# Patient Record
Sex: Male | Born: 1950 | Race: White | Hispanic: No | Marital: Single
Health system: Southern US, Community
[De-identification: ages and names within clinical notes are randomized; demographics above are authoritative.]

---

## 2004-03-12 ENCOUNTER — Encounter: Payer: Self-pay | Admitting: Psychiatry

## 2004-04-12 ENCOUNTER — Encounter: Payer: Self-pay | Admitting: Psychiatry

## 2004-05-12 ENCOUNTER — Encounter: Payer: Self-pay | Admitting: Psychiatry

## 2004-06-12 ENCOUNTER — Encounter: Payer: Self-pay | Admitting: Psychiatry

## 2004-07-13 ENCOUNTER — Encounter: Payer: Self-pay | Admitting: Psychiatry

## 2004-08-10 ENCOUNTER — Encounter: Payer: Self-pay | Admitting: Psychiatry

## 2004-09-10 ENCOUNTER — Encounter: Payer: Self-pay | Admitting: Psychiatry

## 2004-10-10 ENCOUNTER — Encounter: Payer: Self-pay | Admitting: Psychiatry

## 2004-11-10 ENCOUNTER — Encounter: Payer: Self-pay | Admitting: Psychiatry

## 2004-12-10 ENCOUNTER — Encounter: Payer: Self-pay | Admitting: Psychiatry

## 2005-01-10 ENCOUNTER — Encounter: Payer: Self-pay | Admitting: Psychiatry

## 2005-02-10 ENCOUNTER — Encounter: Payer: Self-pay | Admitting: Psychiatry

## 2005-03-12 ENCOUNTER — Encounter: Payer: Self-pay | Admitting: Psychiatry

## 2005-04-12 ENCOUNTER — Encounter: Payer: Self-pay | Admitting: Psychiatry

## 2005-04-20 ENCOUNTER — Inpatient Hospital Stay: Payer: Self-pay | Admitting: Psychiatry

## 2005-04-21 ENCOUNTER — Other Ambulatory Visit: Payer: Self-pay

## 2005-05-05 ENCOUNTER — Other Ambulatory Visit: Payer: Self-pay

## 2005-05-17 ENCOUNTER — Encounter: Payer: Self-pay | Admitting: Psychiatry

## 2005-06-12 ENCOUNTER — Encounter: Payer: Self-pay | Admitting: Psychiatry

## 2005-12-14 ENCOUNTER — Inpatient Hospital Stay: Payer: Self-pay | Admitting: Psychiatry

## 2005-12-14 ENCOUNTER — Other Ambulatory Visit: Payer: Self-pay

## 2006-03-21 ENCOUNTER — Inpatient Hospital Stay: Payer: Self-pay | Admitting: Psychiatry

## 2006-03-25 ENCOUNTER — Emergency Department: Payer: Self-pay | Admitting: Unknown Physician Specialty

## 2006-04-02 ENCOUNTER — Emergency Department: Payer: Self-pay | Admitting: Emergency Medicine

## 2010-03-12 ENCOUNTER — Emergency Department: Payer: Self-pay | Admitting: Emergency Medicine

## 2010-03-17 ENCOUNTER — Emergency Department: Payer: Self-pay | Admitting: Internal Medicine

## 2010-03-22 ENCOUNTER — Ambulatory Visit: Payer: Self-pay | Admitting: Psychiatry

## 2010-10-04 ENCOUNTER — Ambulatory Visit: Payer: Self-pay | Admitting: Gastroenterology

## 2011-08-28 ENCOUNTER — Emergency Department: Payer: Self-pay | Admitting: Emergency Medicine

## 2011-08-28 LAB — DRUG SCREEN, URINE
Barbiturates, Ur Screen: NEGATIVE (ref ?–200)
Cannabinoid 50 Ng, Ur ~~LOC~~: NEGATIVE (ref ?–50)
Cocaine Metabolite,Ur ~~LOC~~: NEGATIVE (ref ?–300)
Methadone, Ur Screen: NEGATIVE (ref ?–300)
Opiate, Ur Screen: NEGATIVE (ref ?–300)
Tricyclic, Ur Screen: NEGATIVE (ref ?–1000)

## 2011-08-28 LAB — COMPREHENSIVE METABOLIC PANEL
Albumin: 4.9 g/dL (ref 3.4–5.0)
Anion Gap: 14 (ref 7–16)
Calcium, Total: 9.6 mg/dL (ref 8.5–10.1)
Chloride: 108 mmol/L — ABNORMAL HIGH (ref 98–107)
EGFR (African American): >60
Glucose: 97 mg/dL (ref 65–99)
Osmolality: 291 (ref 275–301)
Potassium: 3.6 mmol/L (ref 3.5–5.1)
Sodium: 146 mmol/L — ABNORMAL HIGH (ref 136–145)
Total Protein: 8.3 g/dL — ABNORMAL HIGH (ref 6.4–8.2)

## 2011-08-28 LAB — ETHANOL
Ethanol %: <0.003 % (ref 0.000–0.080)
Ethanol: <3 mg/dL

## 2011-08-28 LAB — URINALYSIS, COMPLETE
Bacteria: NONE SEEN
Bilirubin,UR: NEGATIVE
Glucose,UR: NEGATIVE mg/dL (ref 0–75)
Leukocyte Esterase: NEGATIVE
Nitrite: NEGATIVE
Ph: 6 (ref 4.5–8.0)
Specific Gravity: 1.006 (ref 1.003–1.030)
Squamous Epithelial: NONE SEEN
WBC UR: 2 /HPF (ref 0–5)

## 2011-08-28 LAB — CBC
HCT: 42.2 % (ref 40.0–52.0)
HGB: 14 g/dL (ref 13.0–18.0)
MCV: 95 fL (ref 80–100)
RDW: 13.7 % (ref 11.5–14.5)
WBC: 5.8 10*3/uL (ref 3.8–10.6)

## 2011-08-29 LAB — CBC
MCH: 31.5 pg (ref 26.0–34.0)
MCV: 94 fL (ref 80–100)
Platelet: 226 10*3/uL (ref 150–440)
RBC: 4.38 10*6/uL — ABNORMAL LOW (ref 4.40–5.90)
RDW: 13.9 % (ref 11.5–14.5)
WBC: 5.6 10*3/uL (ref 3.8–10.6)

## 2011-08-29 LAB — COMPREHENSIVE METABOLIC PANEL
Alkaline Phosphatase: 73 U/L (ref 50–136)
BUN: 15 mg/dL (ref 7–18)
Chloride: 110 mmol/L — ABNORMAL HIGH (ref 98–107)
Co2: 25 mmol/L (ref 21–32)
EGFR (African American): >60
EGFR (Non-African Amer.): >60
Potassium: 4.1 mmol/L (ref 3.5–5.1)
SGOT(AST): 38 U/L — ABNORMAL HIGH (ref 15–37)
SGPT (ALT): 24 U/L
Total Protein: 7.6 g/dL (ref 6.4–8.2)

## 2011-08-29 LAB — TROPONIN I
Troponin-I: <0.02 ng/mL
Troponin-I: <0.02 ng/mL

## 2011-08-29 LAB — FOLATE: Folic Acid: 25.1 ng/mL (ref 3.1–100.0)

## 2011-08-29 LAB — AMMONIA: Ammonia, Plasma: 39 mcmol/L — ABNORMAL HIGH (ref 11–32)

## 2011-08-29 LAB — LIPASE, BLOOD: Lipase: 287 U/L (ref 73–393)

## 2011-10-04 ENCOUNTER — Emergency Department: Payer: Self-pay | Admitting: Internal Medicine

## 2012-03-27 ENCOUNTER — Ambulatory Visit: Payer: Self-pay | Admitting: Gastroenterology

## 2013-05-27 ENCOUNTER — Encounter: Payer: Self-pay | Admitting: Orthopedic Surgery

## 2013-06-12 ENCOUNTER — Encounter: Payer: Self-pay | Admitting: Orthopedic Surgery

## 2013-11-07 IMAGING — CT CT STONE STUDY
1 of 2 series · 15 of 32 positions shown, 19 images · non-contrast
Comparison: None

REASON FOR EXAM: hematuria
COMMENTS:

PROCEDURE:     CT  - CT ABDOMEN /PELVIS WO (STONE)  - August 29, 2011  [DATE]
RESULT:     Indication: Hematuria
TECHNIQUE: Multiple axial images from the lung bases to the symphysis pubis
were obtained without oral and without intravenous contrast.

[Series 2: 3mm soft tissue · axial · 0.87mm/px · z∈[-310,+188]mm · 15 of 182 slices shown, 19 images]
[im 8/182  soft-tissue]
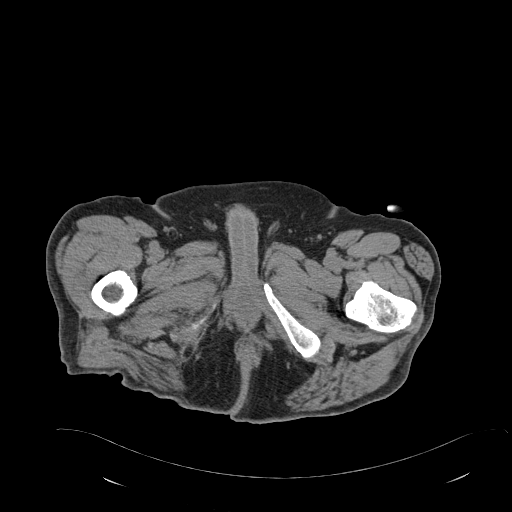
[im 8/182  bone]
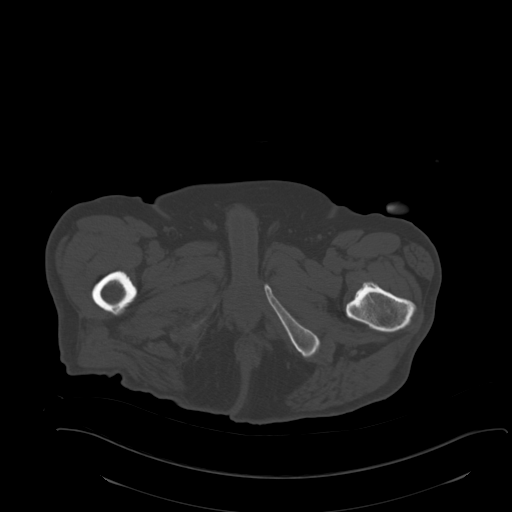
[im 23/182  soft-tissue]
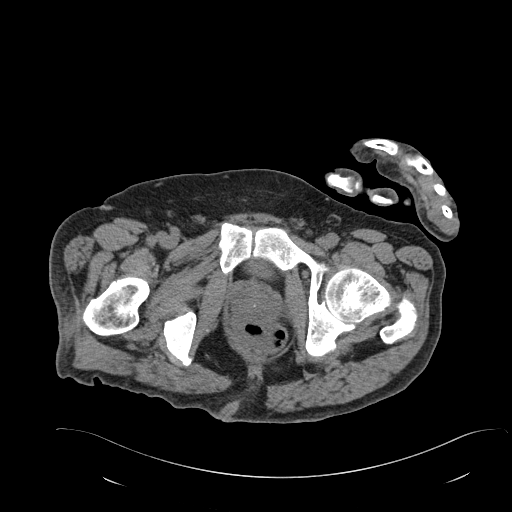
[im 38/182  soft-tissue]
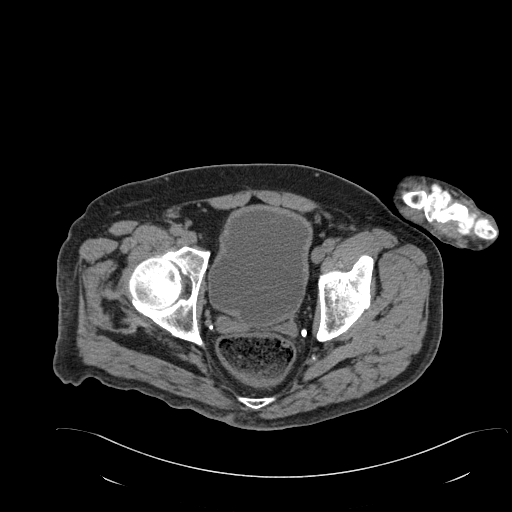
[im 53/182  soft-tissue]
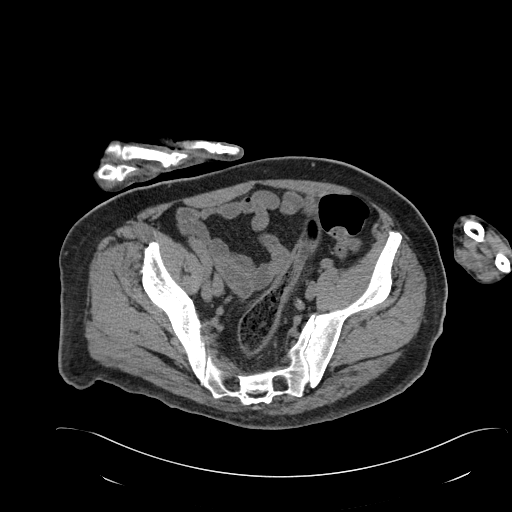
[im 61/182  soft-tissue]
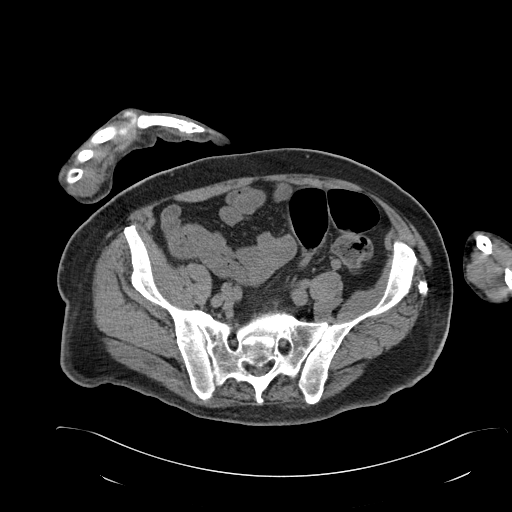
[im 76/182  soft-tissue]
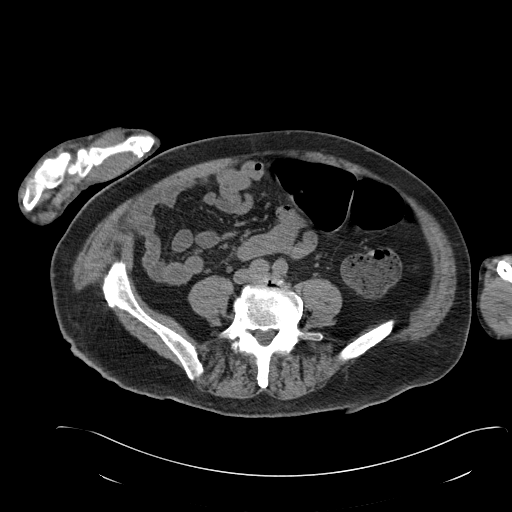
[im 91/182  soft-tissue]
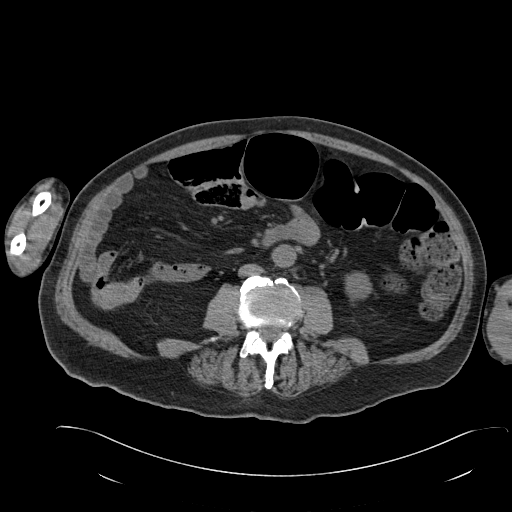
[im 106/182  soft-tissue]
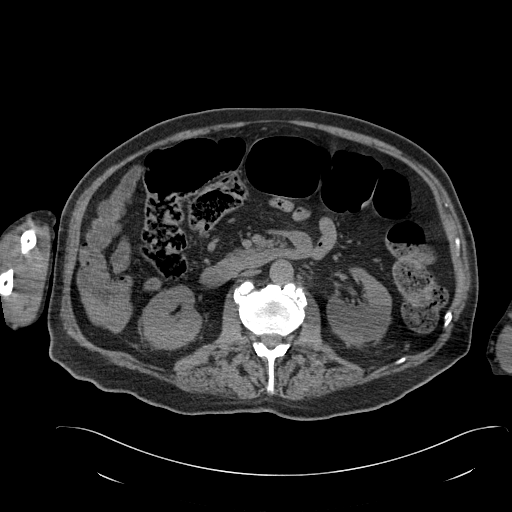
[im 121/182  soft-tissue]
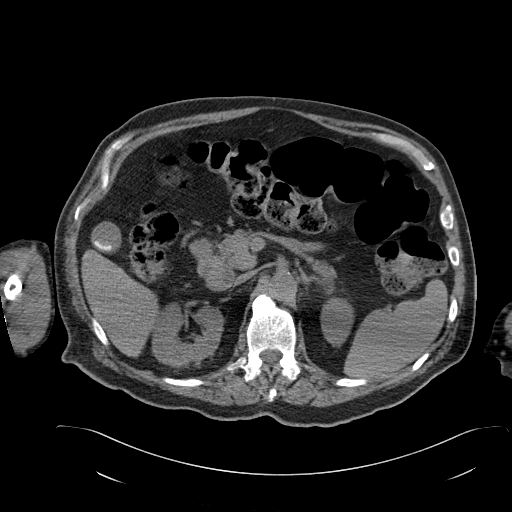
[im 121/182  bone]
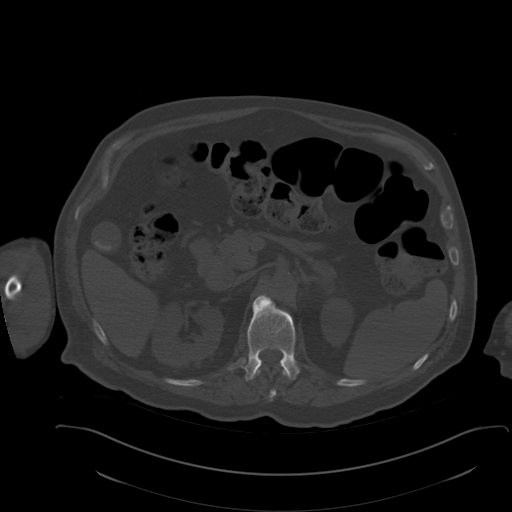
[im 129/182  soft-tissue]
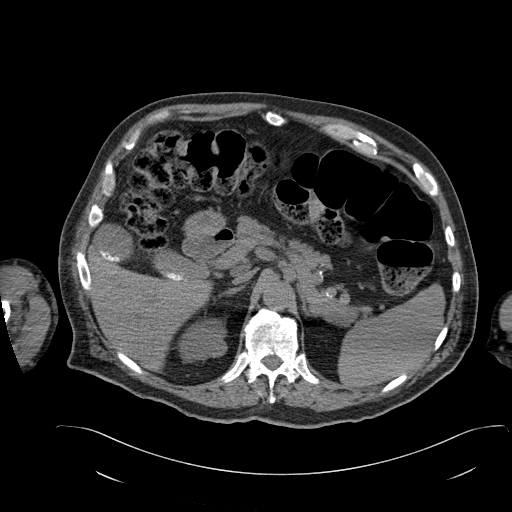
[im 144/182  soft-tissue]
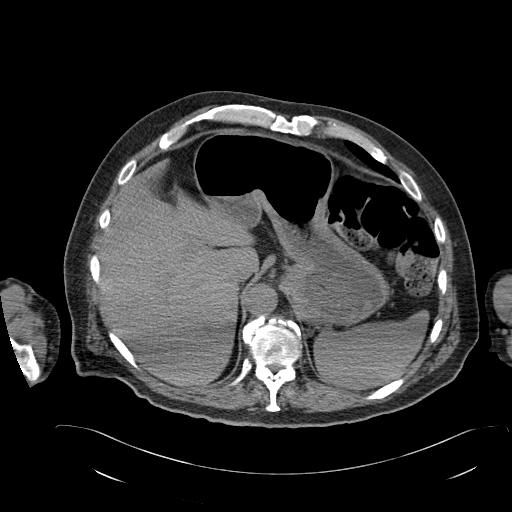
[im 151/182  lung]
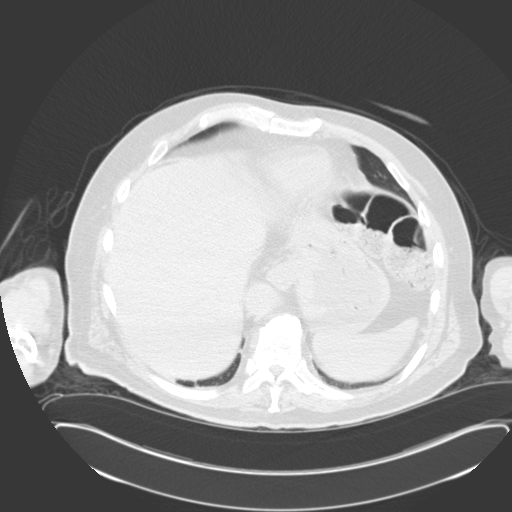
[im 159/182  soft-tissue]
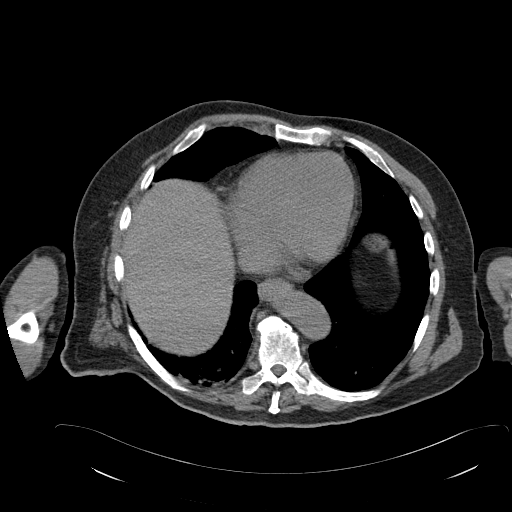
[im 159/182  lung]
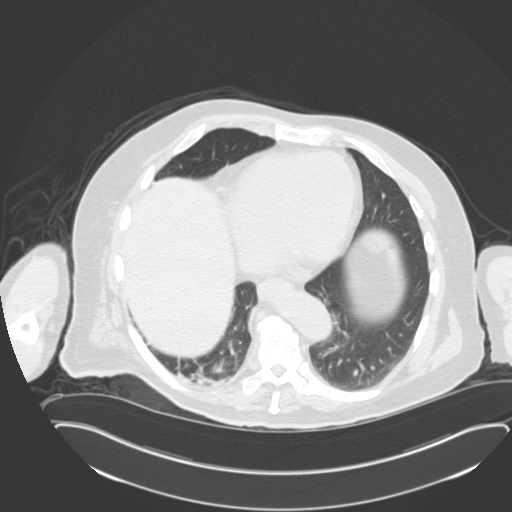
[im 166/182  lung]
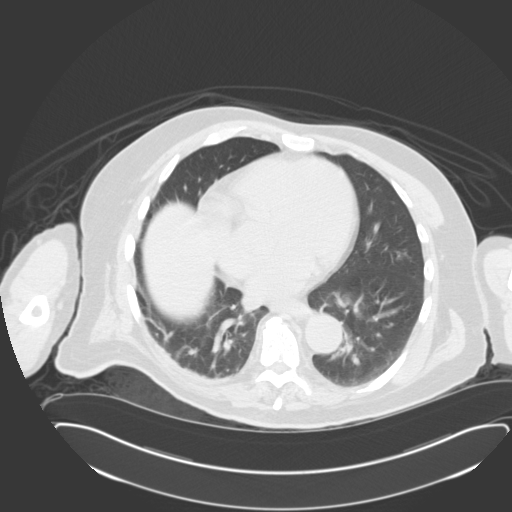
[im 174/182  soft-tissue]
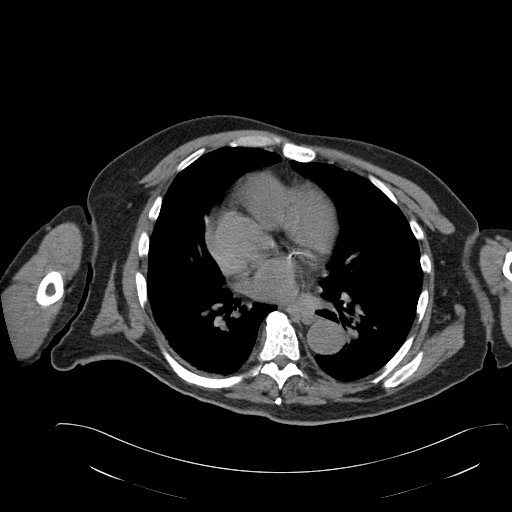
[im 174/182  lung]
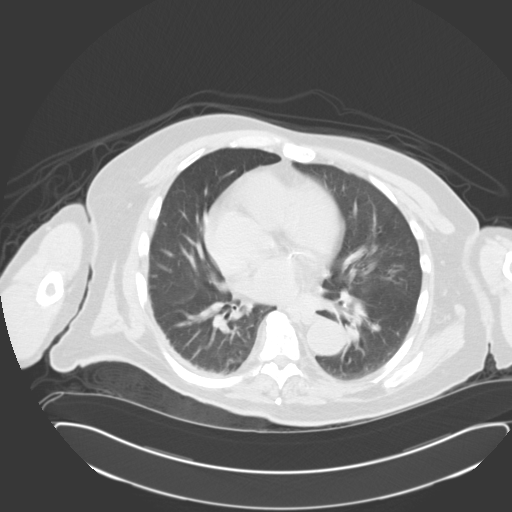

[15 of 32 positions shown; findings below may reference images not displayed]

FINDINGS: The lung bases are clear. There is no pleural or pericardial effusions.

No renal, ureteral, or bladder calculi. No obstructive uropathy. No
perinephric stranding is seen. There is a 10 mm hyperdense, incompletely
characterized, left lower renal lesion. There is mild bladder thickening
which may be secondary to cystitis..

The liver demonstrates no focal abnormality. There are cholelithiasis. The
spleen demonstrates no focal abnormality. The adrenal glands and pancreas
are normal.

The unopacified stomach, duodenum, small intestine, and large intestine are
unremarkable, but evaluation is limited by lack of oral contrast.  There is
no pneumoperitoneum, pneumatosis, or portal venous gas. There is no
abdominal or pelvic free fluid. There is no lymphadenopathy.

The abdominal aorta is normal in caliber with atherosclerosis.

The osseous structures are unremarkable.
IMPRESSION: 1. No urolithiasis or obstructive uropathy.

2. Cholelithiasis.

3. There is mild bladder thickening which may be secondary to cystitis..

## 2014-02-28 ENCOUNTER — Emergency Department: Payer: Self-pay

## 2014-02-28 LAB — CBC
HCT: 36.5 % — AB (ref 40.0–52.0)
HGB: 11.8 g/dL — ABNORMAL LOW (ref 13.0–18.0)
MCH: 31.3 pg (ref 26.0–34.0)
MCHC: 32.5 g/dL (ref 32.0–36.0)
MCV: 96 fL (ref 80–100)
PLATELETS: 123 10*3/uL — AB (ref 150–440)
RBC: 3.78 10*6/uL — AB (ref 4.40–5.90)
RDW: 13.1 % (ref 11.5–14.5)
WBC: 4.6 10*3/uL (ref 3.8–10.6)

## 2014-02-28 LAB — COMPREHENSIVE METABOLIC PANEL
ALBUMIN: 3.3 g/dL — AB (ref 3.4–5.0)
ALK PHOS: 66 U/L
Anion Gap: 7 (ref 7–16)
BUN: 16 mg/dL (ref 7–18)
Bilirubin,Total: 0.3 mg/dL (ref 0.2–1.0)
CALCIUM: 8.4 mg/dL — AB (ref 8.5–10.1)
CO2: 29 mmol/L (ref 21–32)
CREATININE: 1.2 mg/dL (ref 0.60–1.30)
Chloride: 107 mmol/L (ref 98–107)
EGFR (African American): >60
EGFR (Non-African Amer.): >60
Glucose: 92 mg/dL (ref 65–99)
OSMOLALITY: 286 (ref 275–301)
Potassium: 4.2 mmol/L (ref 3.5–5.1)
SGOT(AST): 18 U/L (ref 15–37)
SGPT (ALT): 13 U/L — ABNORMAL LOW
Sodium: 143 mmol/L (ref 136–145)
TOTAL PROTEIN: 6.1 g/dL — AB (ref 6.4–8.2)

## 2014-02-28 LAB — URINALYSIS, COMPLETE
Bacteria: NONE SEEN
Bilirubin,UR: NEGATIVE
Blood: NEGATIVE
GLUCOSE, UR: NEGATIVE mg/dL (ref 0–75)
KETONE: NEGATIVE
Leukocyte Esterase: NEGATIVE
Nitrite: NEGATIVE
PH: 7 (ref 4.5–8.0)
Protein: NEGATIVE
RBC,UR: 8 /HPF (ref 0–5)
Renal Epithelial: <1
Specific Gravity: 1.011 (ref 1.003–1.030)
Squamous Epithelial: NONE SEEN
WBC UR: 1 /HPF (ref 0–5)

## 2014-02-28 LAB — TROPONIN I: Troponin-I: <0.02 ng/mL

## 2014-02-28 LAB — CK TOTAL AND CKMB (NOT AT ARMC)
CK, Total: 41 U/L
CK-MB: <0.5 ng/mL — ABNORMAL LOW (ref 0.5–3.6)

## 2014-03-02 LAB — URINE CULTURE

## 2014-03-04 LAB — CULTURE, BLOOD (SINGLE)

## 2014-03-05 LAB — CULTURE, BLOOD (SINGLE)

## 2014-06-22 ENCOUNTER — Encounter: Payer: Self-pay | Admitting: Nurse Practitioner

## 2014-07-13 ENCOUNTER — Encounter: Payer: Self-pay | Admitting: Nurse Practitioner

## 2014-08-04 ENCOUNTER — Ambulatory Visit: Payer: Self-pay

## 2014-09-29 ENCOUNTER — Ambulatory Visit
Admit: 2014-09-29 | Disposition: A | Discharge status: Home / Self Care | Payer: Self-pay | Attending: Internal Medicine | Admitting: Internal Medicine

## 2014-09-29 LAB — CBC CANCER CENTER
BANDS NEUTROPHIL: 1 %
Basophil #: 0 x10 3/mm (ref 0.0–0.1)
Basophil %: 0.8 %
Comment - H1-Com1: NORMAL
Comment - H1-Com2: NORMAL
EOS PCT: 0 %
EOS PCT: 1 %
Eosinophil #: 0 x10 3/mm (ref 0.0–0.7)
HCT: 38.2 % — AB (ref 40.0–52.0)
HGB: 12.6 g/dL — AB (ref 13.0–18.0)
LYMPHS PCT: 21 %
Lymphocyte #: 0.8 x10 3/mm — ABNORMAL LOW (ref 1.0–3.6)
Lymphocyte %: 17.8 %
MCH: 30.7 pg (ref 26.0–34.0)
MCHC: 33 g/dL (ref 32.0–36.0)
MCV: 93 fL (ref 80–100)
MONO ABS: 0.4 x10 3/mm (ref 0.2–1.0)
MONOS PCT: 8.4 %
Monocytes: 6 %
NEUTROS PCT: 73 %
Neutrophil #: 3.2 x10 3/mm (ref 1.4–6.5)
Platelet: 140 x10 3/mm — ABNORMAL LOW (ref 150–440)
RBC: 4.1 10*6/uL — AB (ref 4.40–5.90)
RDW: 13.9 % (ref 11.5–14.5)
SEGMENTED NEUTROPHILS: 71 %
WBC: 4.4 x10 3/mm (ref 3.8–10.6)

## 2014-09-29 LAB — LACTATE DEHYDROGENASE: LDH: 136 U/L

## 2014-09-29 LAB — PROTIME-INR
INR: 0.9
Prothrombin Time: 12.5 secs

## 2014-09-29 LAB — RETICULOCYTES
Absolute Retic Count: 0.076 10*6/uL (ref 0.019–0.186)
Reticulocyte: 1.9 % (ref 0.4–3.1)

## 2014-09-29 LAB — FERRITIN: FERRITIN (ARMC): 19 ng/mL — AB

## 2014-09-29 LAB — IRON AND TIBC
Iron Bind.Cap.(Total): 339 (ref 250–450)
Iron Saturation: 11.8
Iron: 40 ug/dL — ABNORMAL LOW
Unbound Iron-Bind.Cap.: 298.9

## 2014-09-29 LAB — APTT: ACTIVATED PTT: 30.4 s (ref 23.6–35.9)

## 2014-09-29 LAB — FIBRINOGEN: FIBRINOGEN: 342 mg/dL (ref 210–470)

## 2014-09-29 LAB — FOLATE: Folic Acid: 22.7 ng/mL

## 2014-10-04 NOTE — Consult Note (Signed)
PATIENT NAME:  Adam Sexton, Adam Sexton MR#:  [id number] DATE OF BIRTH:  [birth year]  DATE OF CONSULTATION:  08/29/2011  REFERRING PHYSICIAN:  Glennie Isle, MD  CONSULTING PHYSICIAN:  Adelene Amas. Josuel Koeppen, MD  REASON FOR CONSULTATION: Disorganization, irritability, noncompliant and bizarre behavior in his group home.   HISTORY OF PRESENT ILLNESS: Adam Sexton has been exhibiting an unusual mental status at his group home for approximately four days and this has been a progressive pattern. His thought process has been disorganized. He does have perseveration. He has had unusual agitation while still being on psychotropic medication which usually contains his psychiatric symptoms. He has been noncompliant with redirection. He has been engaging in bizarre behaviors such as pouring water into his cereal bowl and stating, "I ate a bulldog." He is currently undergoing an organic work-up in the Emergency Room. The undersigned has been consulted to help with evaluation as well as psychotropic medication management. At the time of the undersigned's examination, Adam Sexton is not combative. He is mildly agitated. Please see the Mental Status Exam.   In addition to the above bizarre behavior noted at the group home, the patient was also crawling under beds and was taking his clothes off and on. He went outside and wandered away. Also, the staff took him to a pizzeria where he knocked over tables and refused to get back into the [place] to go back to the group home.   By the 17th of March, the patient became incontinent of urine. At one point on the 18th the patient was lying on his gurney in a very resistant physical manner that was not an extrapyramidal syndrome. He answered questions in a bizarre manner. He thought the year was 72 and that he was at a factory. He kept stating, "I am paranoid," in a perseverating pattern.   PAST PSYCHIATRIC HISTORY: His record from his January visit with his psychiatrist showed that  he had somewhat incoherent thought process. He also noted there was thought disorganization along with poor insight and poor judgment. It was noted that he could not spell five letters backwards. On memory testing, he had three out of three immediate and one out of three on recall. At the time he was assessed to have a GAF of 45. It was noted that the patient had not exhibited any angry or hostile episodes. There was lab work ordered, including TSH electrolytes, calcium, B12, and folic acid, however, the results of those are not known.   ALLERGIES: No known drug allergies.   PAST MEDICAL HISTORY:  1. Hypertension.  2. Hyperthyroidism.  3. Gastroesophageal reflux disease.   MEDICATIONS: Medications are reviewed. The psychotropics include: 1. Zyprexa 15 mg b.i.d.  2. Namenda 5 mg b.i.d.  3. Lithium 300 mg b.i.d.  4. Synthroid 0.025 mg daily.   LABORATORY, DIAGNOSTIC AND RADIOLOGICAL DATA:  A head CT without contrast showed no acute intracranial process.  WBC normal. Hemoglobin normal. Platelet count 226.  SGOT and SGPT are unremarkable. BUN and creatinine are unremarkable. Glucose is 103.  Urine drug screen negative.  Urinalysis did show some hematuria, 2 WBC, and no epithelials.  Lithium level was 1.05.   REVIEW OF SYSTEMS: The patient cannot provide review of systems. It is gleaned from the chart, discussion with the Triage nurse and outpatient record available. Constitutional, HEENT, mouth, neurologic, psychiatric, cardiovascular, respiratory, gastrointestinal, genitourinary, skin, musculoskeletal, hematologic, lymphatic, endocrine, and metabolic are all unremarkable.   PHYSICAL EXAMINATION:  VITAL SIGNS: Temperature 99.2, pulse 70, respiratory rate 18,  blood pressure 137/85.   GENERAL APPEARANCE: Adam Sexton is an elderly male lying in a supine position in his gurney with no abnormal involuntary movements. He has no cachexia. Muscle tone is normal. His grooming and hygiene are normal.    MENTAL STATUS EXAM: Adam Sexton looks back and forth in a mildly agitated state. He does not maintain consistent eye contact. His concentration is decreased. He is oriented only to self. When asked questions, he often perseverates.  At this point, he is perseverating on the word "moonlight," at times he will say "walking in the moonlight." He cannot perform memory assessment or abstraction assessment. His fund of knowledge, use of language, and intelligence are estimated to be below that of his previous impaired baseline. His speech is disorganized, slightly pressured at times. There is dysarthria which is apparently his baseline. Thought process is disorganized with perseveration. Thought content: There may be some hallucinations. Content is difficult to discern. His insight is poor. Judgment is impaired. Affect is mildly agitated. Mood is slightly anxious.   ASSESSMENT:  AXIS I:  1. Delirium, not otherwise specified.  2. Mood disorder, not otherwise specified.  3. Psychotic disorder, not otherwise specified.   He does have a history of what appears to be baseline mood episodes with severe agitation along with psychotic symptoms which are a part of his baseline central nervous system impairment. However, the current condition appears to be a delirium on top of his baseline condition.   AXIS II: Deferred.   AXIS III: Please see the past medical history.   AXIS IV: General medical.   AXIS V: 20.   Adam Sexton lacks the criteria for capacity. He cannot make a consistent choice. He does not have the memory ability to hold onto two options as he assesses the risks versus benefits of both options. He cannot appreciate his risks due to his symptoms. Also, he cannot reason well. Adam Sexton does not have the capacity for informed consent.   RECOMMENDATIONS:  1. At this point, I would reduce his Zyprexa down to 10 mg b.i.d.  It is unlikely that he will gain any additional benefit from 30 mg of  Zyprexa per day, and the increased dosage could produce akathisia, contributing to agitation. Also, his EKG report in the record is pending. If he has a QTc greater than 500 milliseconds, I would stop the Zyprexa.  2. I would continue his lithium dosage and would keep in mind that it is undetermined at this point whether he was at  steady state or not when coming in on his lithium; therefore, I would check a 12-hour lithium trough level (12 hours after his nocturnal dose and before his a.m. dose) every two days to assure that his current dosage does not get him into toxicity. The steady state level will be reached at six days for lithium.   3. I would utilize Ativan 1 to 2 mg p.o. or IM every 6 hours p.r.n. agitation with 2 mg reserved only for severe agitation or combativeness.  4. For additional reversible central nervous system etiology work-up, I would add RPR, ammonia, folic acid and B12.  5. I would keep stimulation as low as possible with basic ego support provided.   ____________________________ Adelene AmasJames S. Ladona Rosten, MD jsw:cbb D: 08/30/2011 00:08:07 ET T: 08/30/2011 10:06:09 ET JOB#: [id number]  cc: Adelene AmasJames S. Deetta Siegmann, MD, <Dictator> Lester CarolinaJAMES S Meg Niemeier MD ELECTRONICALLY SIGNED 09/02/2011 20:46

## 2014-10-06 LAB — PROT IMMUNOELECTROPHORES(ARMC)

## 2014-10-06 LAB — KAPPA/LAMBDA FREE LIGHT CHAINS (ARMC)

## 2014-11-17 ENCOUNTER — Other Ambulatory Visit: Payer: Self-pay | Admitting: Family Medicine

## 2014-11-17 DIAGNOSIS — R1310 Dysphagia, unspecified: Secondary | ICD-10-CM

## 2014-11-30 ENCOUNTER — Ambulatory Visit: Admission: RE | Admit: 2014-11-30 | Payer: Self-pay | Source: Ambulatory Visit

## 2014-12-11 DEATH — deceased

## 2016-10-13 IMAGING — CT CT ABDOMEN WO/W CM
2 of 10 series · 10 of 46 positions shown, 16 images · IV contrast (omnipaque)
Comparison: 08/29/2011

CLINICAL DATA: Followup renal mass.

EXAM:
CT ABDOMEN WITHOUT AND WITH CONTRAST
TECHNIQUE: Multidetector CT imaging of the abdomen was performed following the
standard protocol before and following the bolus administration of
intravenous contrast.
CONTRAST:  100 cc Omnipaque 350

[Series 3: renal arterial · axial · arterial · 0.85mm/px · z∈[-708,-484]mm · 8 of 145 slices shown, 13 images]
[im 17/145  soft-tissue]
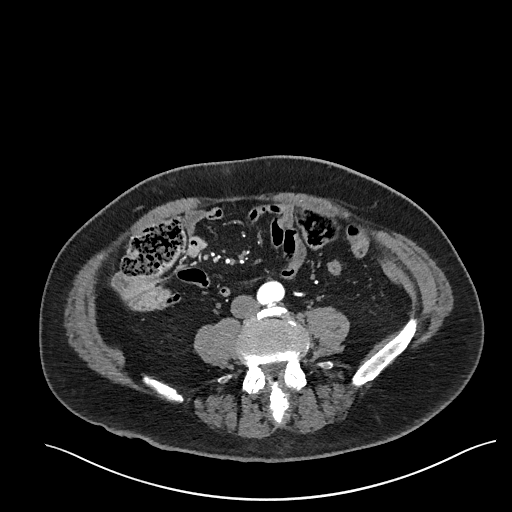
[im 17/145  bone]
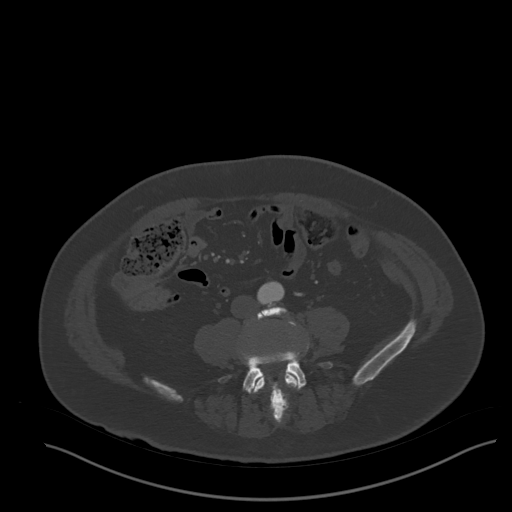
[im 33/145  soft-tissue]
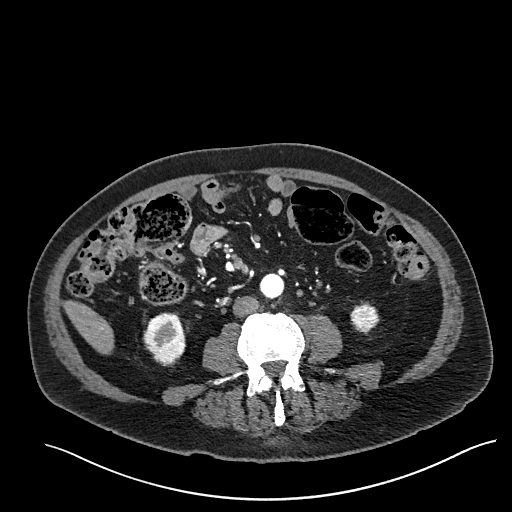
[im 49/145  soft-tissue]
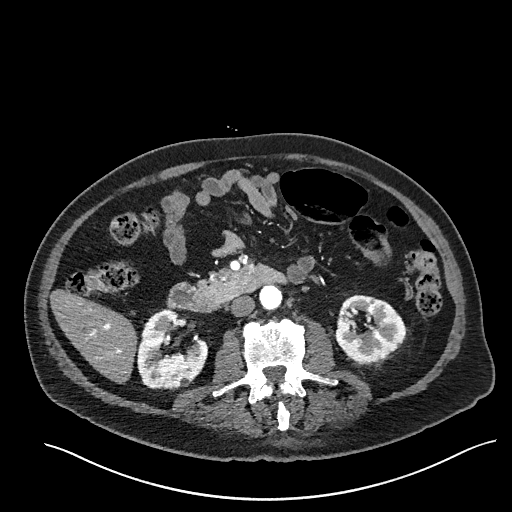
[im 65/145  soft-tissue]
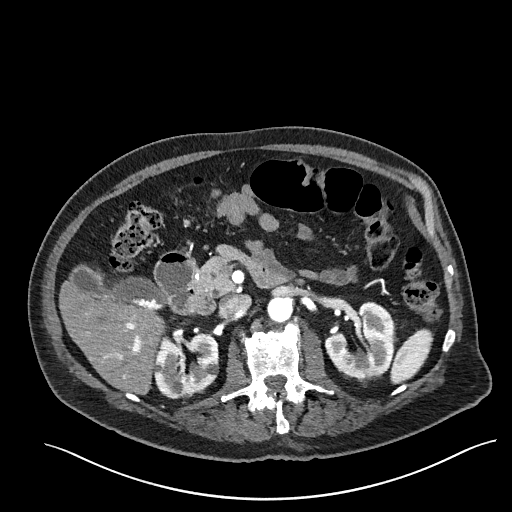
[im 81/145  soft-tissue]
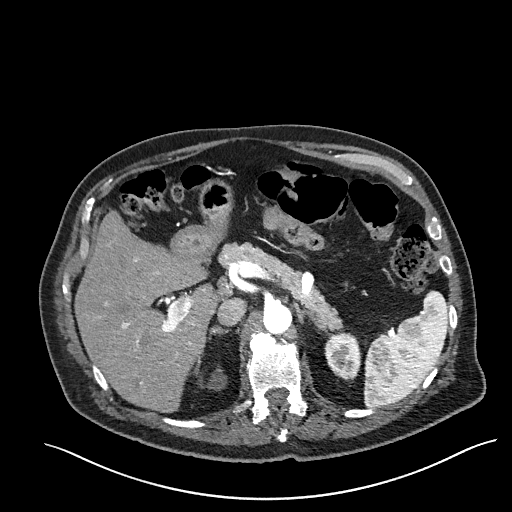
[im 81/145  lung]
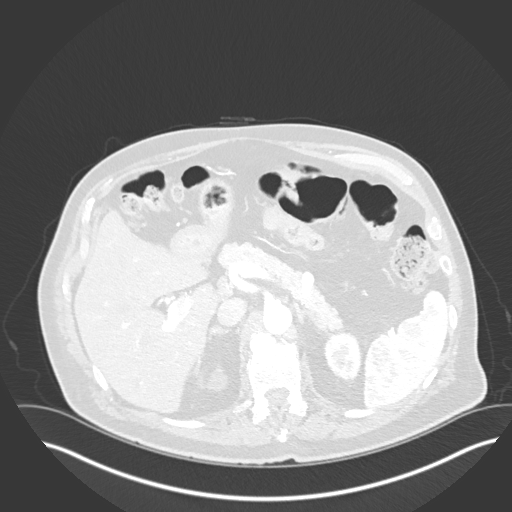
[im 97/145  soft-tissue]
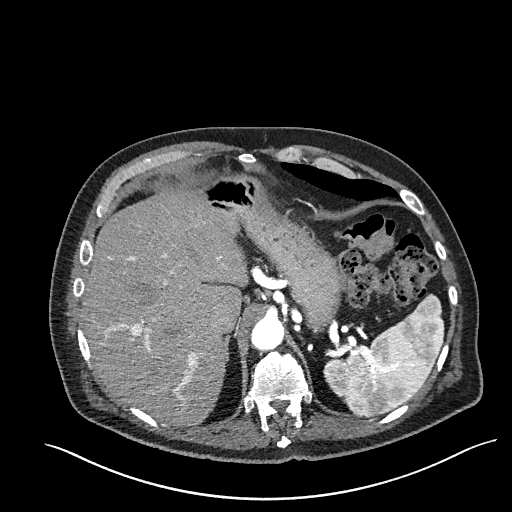
[im 97/145  lung]
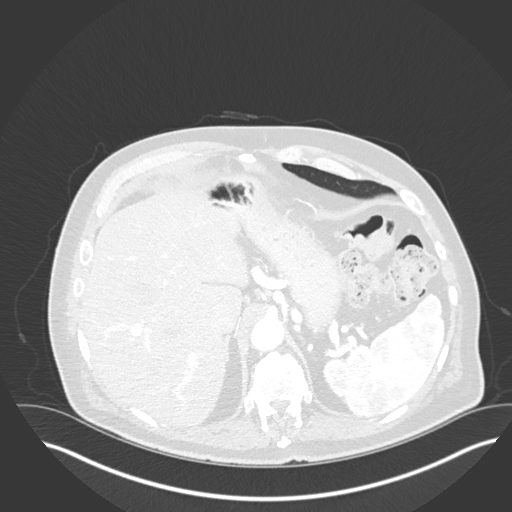
[im 113/145  soft-tissue]
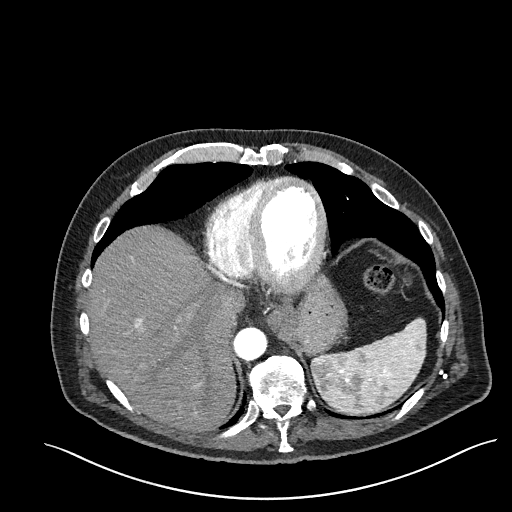
[im 113/145  lung]
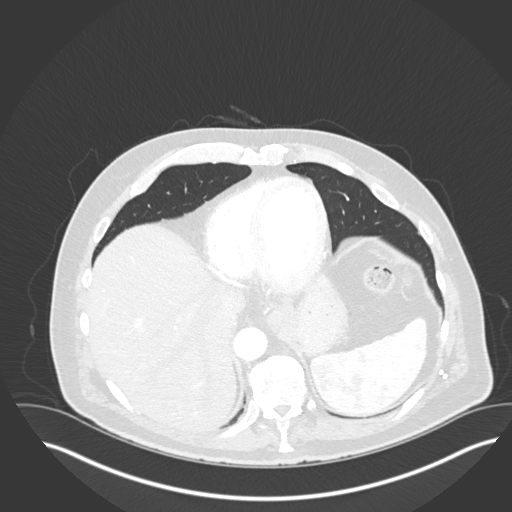
[im 129/145  soft-tissue]
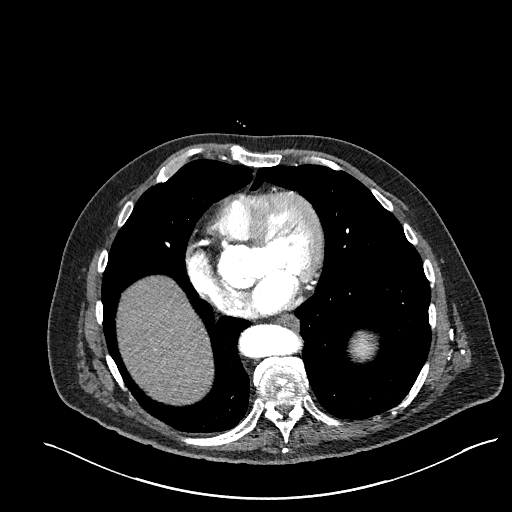
[im 129/145  lung]
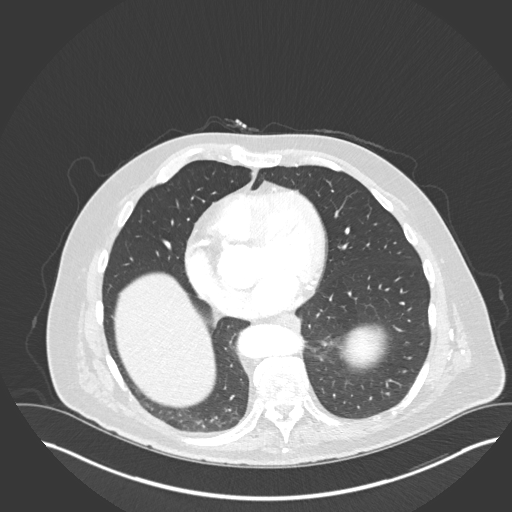

[Series 7: renal without cor · coronal · non-contrast · 0.51mm/px · 2 of 148 slices shown, 3 images]
[im 50/148  soft-tissue]
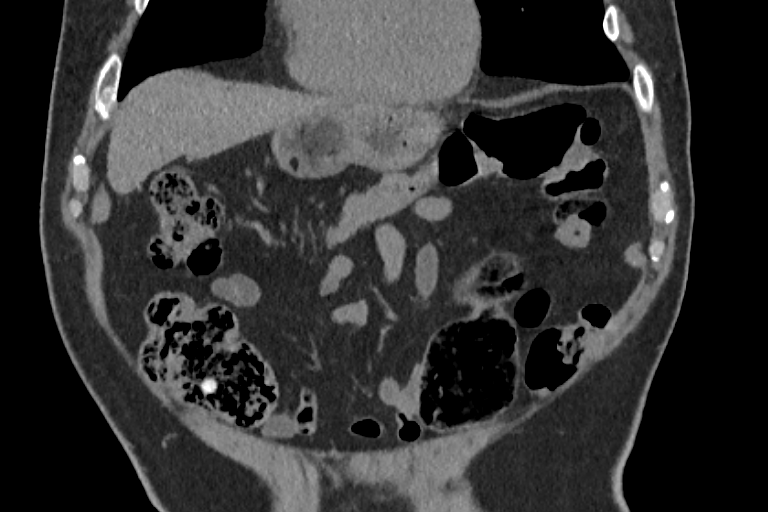
[im 50/148  bone]
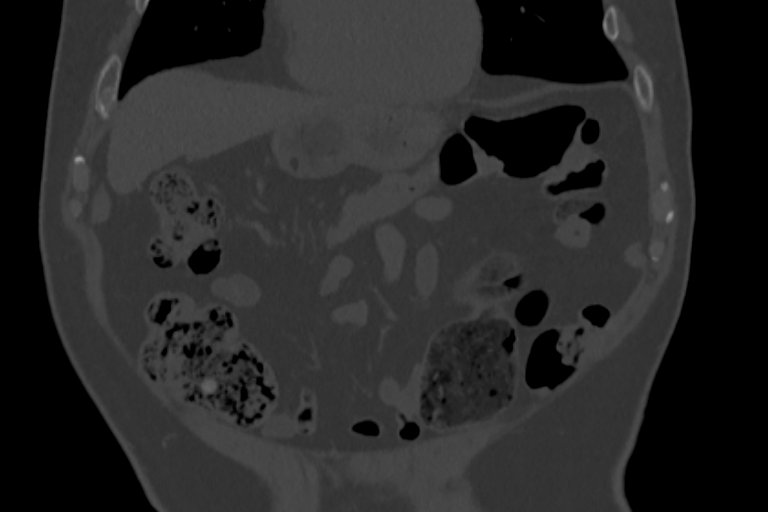
[im 99/148  soft-tissue]
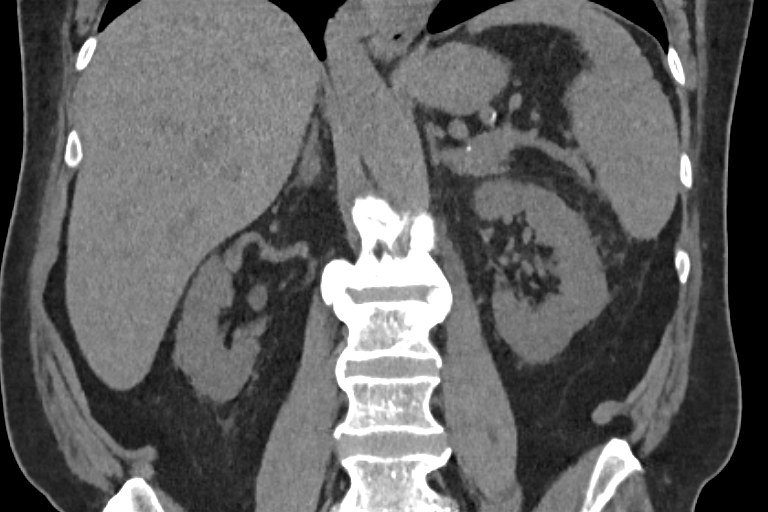

[10 of 46 positions shown; findings below may reference images not displayed]

FINDINGS: Lower chest: The lung bases are clear. No pleural effusion or
worrisome pulmonary nodules. Emphysematous changes are noted. The
heart is normal in size. No pericardial effusion. The aorta is
tortuous and mildly ectatic. The distal esophagus is grossly normal.

Hepatobiliary: Diffuse fatty infiltration of the liver but no focal
hepatic lesions or intrahepatic biliary dilatation. Numerous small
layering gallstones are noted in the gallbladder but no findings for
acute cholecystitis The common bile duct is normal in caliber.

Pancreas: Normal

Spleen: Normal

Adrenals/Urinary Tract: The adrenal glands are normal. There are
numerous bilateral renal cysts, some of which are complicated by
hemorrhage. No worrisome enhancing renal lesions. No renal or
obstructing upper ureteral calculi.

Stomach/Bowel: The stomach, duodenum, visualized small bowel and
visualized colon are unremarkable. No inflammatory changes, mass
lesions or obstructive findings.

Vascular/Lymphatic: The aorta and branch vessels are patent. The
major venous structures are patent. No mesenteric or retroperitoneal
mass or adenopathy.

Other: No free fluid or abdominal wall hernia.

Musculoskeletal: No significant osseous findings. Advanced
degenerative changes involving the spine.
IMPRESSION: Numerous bilateral renal cysts, some of which are complicated by
hemorrhage but no worrisome enhancing renal mass lesion.

Diffuse fatty infiltration of the liver.

Cholelithiasis.
